# Patient Record
Sex: Male | Born: 1995 | Race: Black or African American | Hispanic: No | Marital: Single
Health system: Southern US, Community
[De-identification: ages and names within clinical notes are randomized; demographics above are authoritative.]

## PROBLEM LIST (undated history)

## (undated) DIAGNOSIS — S52502A Unspecified fracture of the lower end of left radius, initial encounter for closed fracture: Secondary | ICD-10-CM

## (undated) DIAGNOSIS — R011 Cardiac murmur, unspecified: Secondary | ICD-10-CM

## (undated) HISTORY — DX: Cardiac murmur, unspecified: R01.1

## (undated) HISTORY — DX: Unspecified fracture of the lower end of left radius, initial encounter for closed fracture: S52.502A

---
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
## 2016-05-10 IMAGING — CR DG CHEST 2V
2 series · 2 of 2 positions shown · non-contrast
Comparison: No priors.

CLINICAL DATA: 18-year-old male presenting with dizziness,
lightheadedness and shortness of breath earlier today. Possible
dehydration.

EXAM:
CHEST  2 VIEW

[w chest pa]
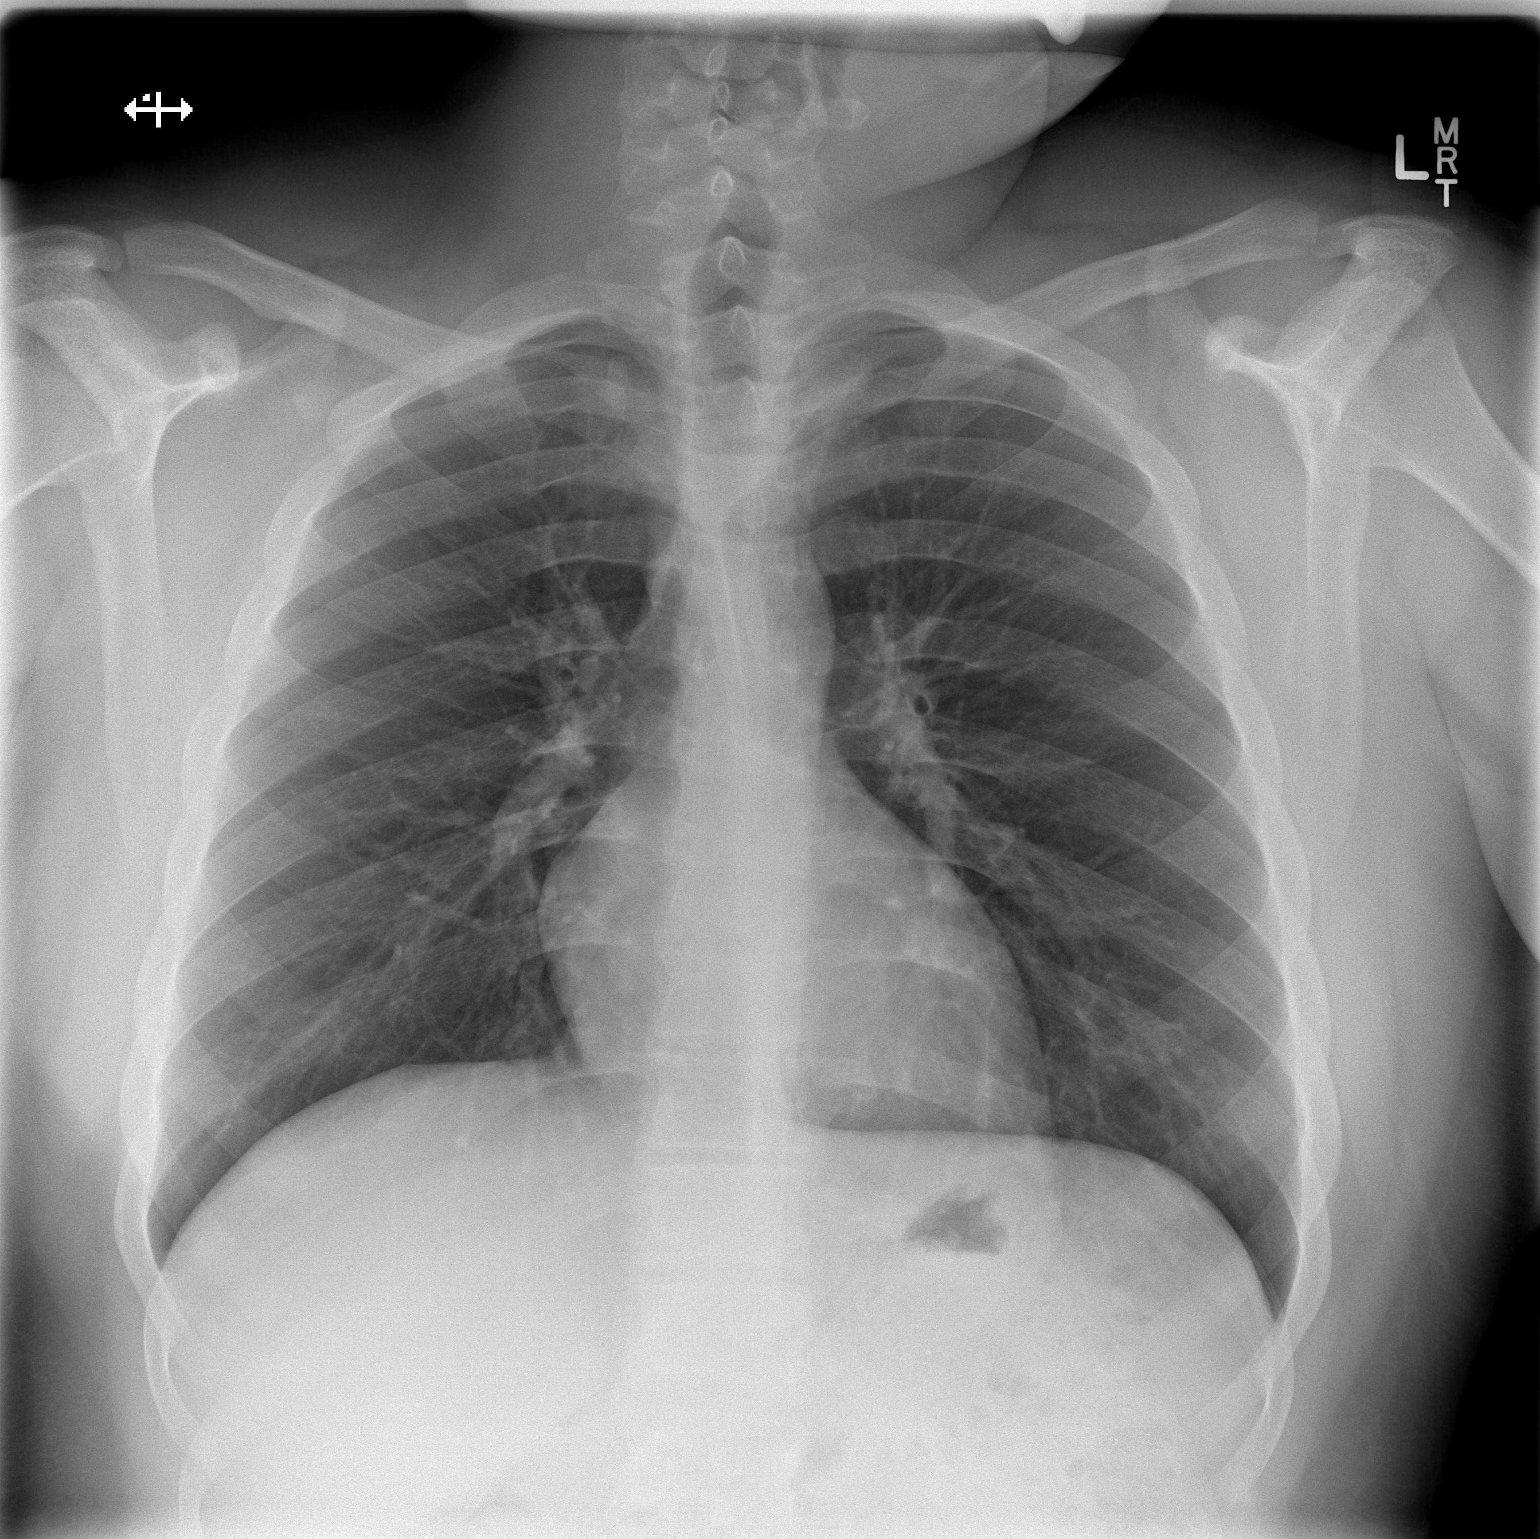

[w chest lat]
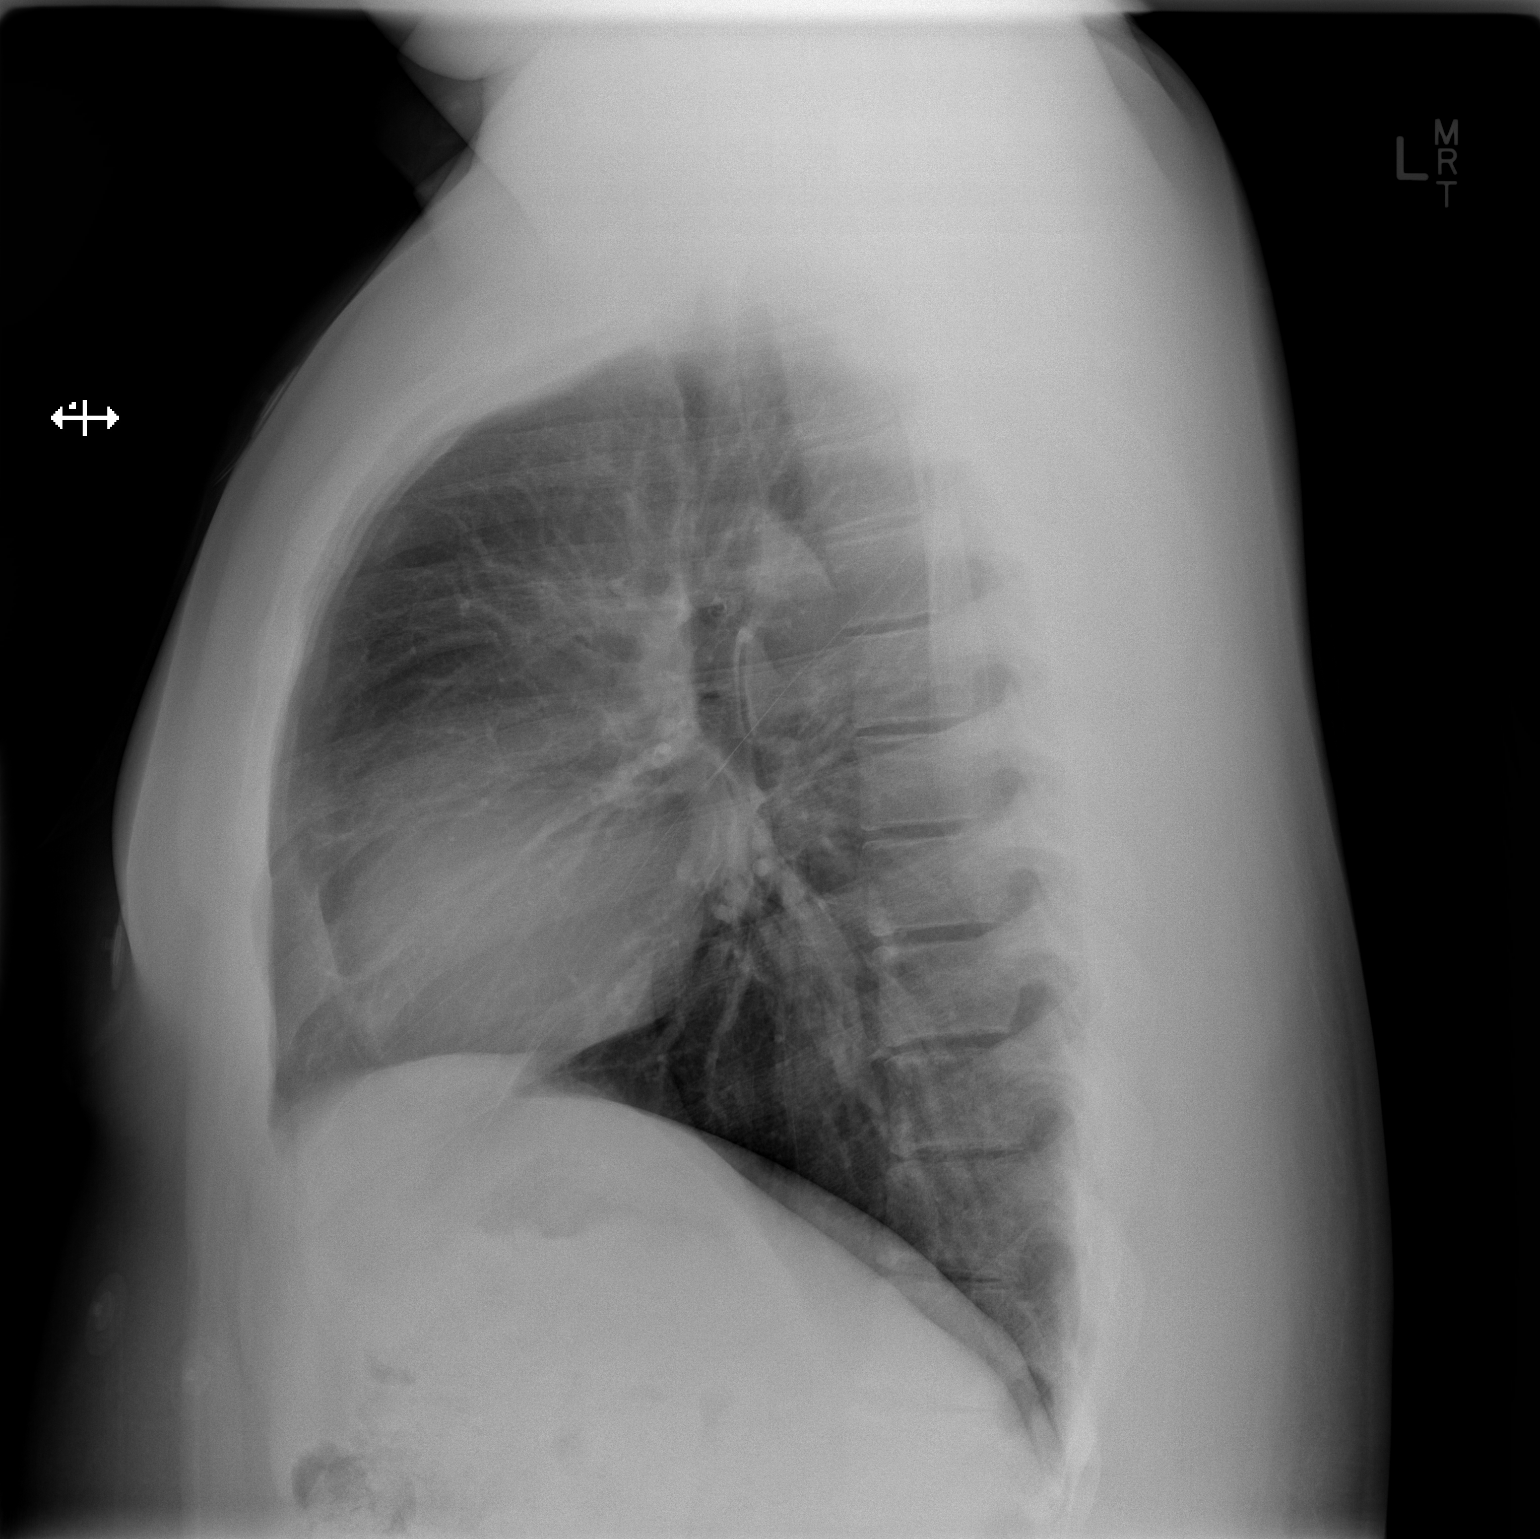

[2 of 2 positions shown; findings below may reference images not displayed]

FINDINGS: Lung volumes are normal. No consolidative airspace disease. No
pleural effusions. No pneumothorax. No pulmonary nodule or mass
noted. Pulmonary vasculature and the cardiomediastinal silhouette
are within normal limits.
IMPRESSION: No radiographic evidence of acute cardiopulmonary disease.

## 2017-02-12 ENCOUNTER — Encounter: Payer: Self-pay | Admitting: Pediatrics

## 2017-02-14 ENCOUNTER — Encounter: Payer: Self-pay | Admitting: Pediatrics

## 2020-04-25 ENCOUNTER — Ambulatory Visit: Payer: Self-pay | Attending: Internal Medicine

## 2020-04-25 DIAGNOSIS — Z23 Encounter for immunization: Secondary | ICD-10-CM

## 2020-05-17 ENCOUNTER — Ambulatory Visit: Payer: Medicaid Other | Attending: Internal Medicine

## 2020-05-17 DIAGNOSIS — Z23 Encounter for immunization: Secondary | ICD-10-CM

## 2020-05-17 NOTE — Progress Notes (Signed)
   Covid-[facility] Vaccination [facility]  Name:  JANICE SEALES    MRN: [id number] DOB: Apr 26, 1996  05/17/2020  Mr. Taplin was observed post Covid-19 immunization for 15 minutes without incident. He was provided with Vaccine Information Sheet and instruction to access the V-Safe system.   Mr. Sapp was instructed to call 911 with any severe reactions post vaccine: Marland Kitchen Difficulty breathing  . Swelling of face and throat  . A fast heartbeat  . A bad rash all over body  . Dizziness and weakness   Immunizations Administered    Name Date Dose VIS Date Route   Pfizer COVID-19 Vaccine 05/17/2020 11:10 AM 0.3 mL 02/10/2019 Intramuscular   Manufacturer: [employer], [employer]   Lot: [id number]   NDC: [address]-5
